# Patient Record
Sex: Male | Born: 1990 | Race: White | Hispanic: Yes | Marital: Single | Smoking: Never smoker
Health system: Southern US, Community
[De-identification: ages and names within clinical notes are randomized; demographics above are authoritative.]

---
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
## 2010-06-03 LAB — CBC
Hemoglobin: 15 g/dL (ref 13.0–17.0)
MCHC: 34.2 g/dL (ref 30.0–36.0)
MCHC: 34.8 g/dL (ref 30.0–36.0)
RBC: 4.8 MIL/uL (ref 4.22–5.81)
RDW: 12.5 % (ref 11.5–15.5)

## 2010-06-03 LAB — DIFFERENTIAL
Basophils Absolute: 0 10*3/uL (ref 0.0–0.1)
Basophils Relative: 0 % (ref 0–1)
Eosinophils Absolute: 0.1 10*3/uL (ref 0.0–0.7)
Lymphs Abs: 2.1 10*3/uL (ref 0.7–4.0)
Neutrophils Relative %: 63 % (ref 43–77)

## 2010-06-03 LAB — PROTIME-INR
INR: 1 (ref 0.00–1.49)
Prothrombin Time: 12.7 seconds (ref 11.6–15.2)

## 2010-06-03 LAB — BASIC METABOLIC PANEL
BUN: 13 mg/dL (ref 6–23)
CO2: 27 mEq/L (ref 19–32)
Calcium: 8.3 mg/dL — ABNORMAL LOW (ref 8.4–10.5)
Creatinine, Ser: 0.81 mg/dL (ref 0.4–1.5)
Glucose, Bld: 131 mg/dL — ABNORMAL HIGH (ref 70–99)

## 2010-06-03 LAB — COMPREHENSIVE METABOLIC PANEL
ALT: 37 U/L (ref 0–53)
Alkaline Phosphatase: 76 U/L (ref 39–117)
CO2: 26 mEq/L (ref 19–32)
Calcium: 8.8 mg/dL (ref 8.4–10.5)
GFR calc non Af Amer: >60 mL/min (ref >60)
Glucose, Bld: 141 mg/dL — ABNORMAL HIGH (ref 70–99)
Sodium: 139 mEq/L (ref 135–145)

## 2010-08-22 ENCOUNTER — Emergency Department (HOSPITAL_COMMUNITY)
Admission: EM | Admit: 2010-08-22 | Discharge: 2010-08-23 | Disposition: A | Discharge status: Home / Self Care | Payer: Self-pay | Attending: Emergency Medicine | Admitting: Emergency Medicine

## 2010-08-22 DIAGNOSIS — F1010 Alcohol abuse, uncomplicated: Secondary | ICD-10-CM | POA: Insufficient documentation

## 2015-03-11 ENCOUNTER — Emergency Department (HOSPITAL_COMMUNITY)
Admission: EM | Admit: 2015-03-11 | Discharge: 2015-03-12 | Disposition: A | Discharge status: Home / Self Care | Payer: No Typology Code available for payment source | Attending: Emergency Medicine | Admitting: Emergency Medicine

## 2015-03-11 ENCOUNTER — Encounter (HOSPITAL_COMMUNITY): Payer: Self-pay | Admitting: Emergency Medicine

## 2015-03-11 ENCOUNTER — Emergency Department (HOSPITAL_COMMUNITY): Payer: No Typology Code available for payment source

## 2015-03-11 DIAGNOSIS — Y9389 Activity, other specified: Secondary | ICD-10-CM | POA: Diagnosis not present

## 2015-03-11 DIAGNOSIS — X58XXXA Exposure to other specified factors, initial encounter: Secondary | ICD-10-CM | POA: Diagnosis not present

## 2015-03-11 DIAGNOSIS — S21201A Unspecified open wound of right back wall of thorax without penetration into thoracic cavity, initial encounter: Secondary | ICD-10-CM | POA: Insufficient documentation

## 2015-03-11 DIAGNOSIS — Y9289 Other specified places as the place of occurrence of the external cause: Secondary | ICD-10-CM | POA: Insufficient documentation

## 2015-03-11 DIAGNOSIS — Z23 Encounter for immunization: Secondary | ICD-10-CM | POA: Insufficient documentation

## 2015-03-11 DIAGNOSIS — M7989 Other specified soft tissue disorders: Secondary | ICD-10-CM | POA: Diagnosis not present

## 2015-03-11 DIAGNOSIS — S21211A Laceration without foreign body of right back wall of thorax without penetration into thoracic cavity, initial encounter: Secondary | ICD-10-CM

## 2015-03-11 DIAGNOSIS — Y998 Other external cause status: Secondary | ICD-10-CM | POA: Insufficient documentation

## 2015-03-11 LAB — PREPARE FRESH FROZEN PLASMA
Unit division: 0
Unit division: 0

## 2015-03-11 LAB — CBC
HEMATOCRIT: 39.9 % (ref 39.0–52.0)
Hemoglobin: 14.1 g/dL (ref 13.0–17.0)
MCH: 30.8 pg (ref 26.0–34.0)
MCHC: 35.3 g/dL (ref 30.0–36.0)
MCV: 87.1 fL (ref 78.0–100.0)
PLATELETS: 231 10*3/uL (ref 150–400)
RBC: 4.58 MIL/uL (ref 4.22–5.81)
RDW: 12.7 % (ref 11.5–15.5)
WBC: 9.9 10*3/uL (ref 4.0–10.5)

## 2015-03-11 LAB — COMPREHENSIVE METABOLIC PANEL
ALT: 51 U/L (ref 17–63)
AST: 39 U/L (ref 15–41)
Albumin: 4.3 g/dL (ref 3.5–5.0)
Alkaline Phosphatase: 68 U/L (ref 38–126)
Anion gap: 15 (ref 5–15)
BILIRUBIN TOTAL: 1.1 mg/dL (ref 0.3–1.2)
BUN: 16 mg/dL (ref 6–20)
CALCIUM: 9 mg/dL (ref 8.9–10.3)
CO2: 22 mmol/L (ref 22–32)
CREATININE: 1.35 mg/dL — AB (ref 0.61–1.24)
Chloride: 105 mmol/L (ref 101–111)
GFR calc Af Amer: >60 mL/min (ref >60)
Glucose, Bld: 173 mg/dL — ABNORMAL HIGH (ref 65–99)
POTASSIUM: 3.7 mmol/L (ref 3.5–5.1)
Sodium: 142 mmol/L (ref 135–145)
TOTAL PROTEIN: 6.8 g/dL (ref 6.5–8.1)

## 2015-03-11 LAB — ETHANOL

## 2015-03-11 LAB — ABO/RH: ABO/RH(D): O POS

## 2015-03-11 MED ORDER — TETANUS-DIPHTH-ACELL PERTUSSIS 5-2.5-18.5 LF-MCG/0.5 IM SUSP
INTRAMUSCULAR | Status: AC
  Administered 2015-03-11: 0.5 mL via INTRAMUSCULAR
  Filled 2015-03-11: qty 0.5

## 2015-03-11 MED ORDER — SODIUM CHLORIDE 0.9 % IV BOLUS (SEPSIS)
500.0000 mL | Freq: Once | INTRAVENOUS | Status: AC
  Administered 2015-03-11: 500 mL via INTRAVENOUS

## 2015-03-11 MED ORDER — FENTANYL CITRATE (PF) 100 MCG/2ML IJ SOLN
INTRAMUSCULAR | Status: AC
  Filled 2015-03-11: qty 2

## 2015-03-11 MED ORDER — TETANUS-DIPHTHERIA TOXOIDS TD 5-2 LFU IM INJ: 0.5000 mL | INJECTION | Freq: Once | INTRAMUSCULAR | Status: DC

## 2015-03-11 MED ORDER — IOHEXOL 300 MG/ML  SOLN
100.0000 mL | Freq: Once | INTRAMUSCULAR | Status: AC | PRN
  Administered 2015-03-11: 100 mL via INTRAVENOUS

## 2015-03-11 MED ORDER — FENTANYL CITRATE (PF) 100 MCG/2ML IJ SOLN
50.0000 ug | Freq: Once | INTRAMUSCULAR | Status: AC
Start: 2015-03-11 — End: 2015-03-11
  Administered 2015-03-11: 50 ug via INTRAVENOUS

## 2015-03-11 MED ORDER — SODIUM CHLORIDE 0.9 % IV BOLUS (SEPSIS)
125.0000 mL | Freq: Once | INTRAVENOUS | Status: AC
  Administered 2015-03-11: 500 mL via INTRAVENOUS

## 2015-03-11 MED ORDER — LIDOCAINE-EPINEPHRINE (PF) 2 %-1:200000 IJ SOLN
20.0000 mL | Freq: Once | INTRAMUSCULAR | Status: AC
  Administered 2015-03-11: 20 mL
  Filled 2015-03-11: qty 20

## 2015-03-11 NOTE — ED Notes (Signed)
PT. TRANSPORTED TO CT SCAN. 
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
## 2015-03-11 NOTE — Progress Notes (Signed)
   03/11/15 2200  Clinical Encounter Type  Visited With Patient and family together;Health care provider  Visit Type Initial;Psychological support;Spiritual support;Critical Care;ED;Trauma  Referral From Care management  Consult/Referral To None  Spiritual Encounters  Spiritual Needs Emotional  Stress Factors  Patient Stress Factors Exhausted  Family Stress Factors Exhausted   Chaplain Visited with Pt. And Pt's brother. And provided emotional support.
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
## 2015-03-12 LAB — TYPE AND SCREEN
ABO/RH(D): O POS
ANTIBODY SCREEN: NEGATIVE
UNIT DIVISION: 0
UNIT DIVISION: 0

## 2015-03-12 MED ORDER — AMOXICILLIN-POT CLAVULANATE 875-125 MG PO TABS: 1.0000 | ORAL_TABLET | Freq: Two times a day (BID) | ORAL | Status: AC

## 2015-03-12 MED ORDER — HYDROCODONE-ACETAMINOPHEN 5-325 MG PO TABS: 1.0000 | ORAL_TABLET | ORAL | Status: AC | PRN

## 2015-03-12 NOTE — ED Notes (Signed)
Patient is alert and orientedx4.  Patient was explained discharge instructions and they understood them with no questions.  His brother Tonny Bollmanzequiel Schone is here with him and understood the discharge instructions.  He is taking the patient home.

## 2015-03-12 NOTE — Discharge Instructions (Signed)
Cuidado de Patent examinerun desgarro en los adultos Follow up for suture removal in 1 week. Return to the ED if you develop new or worsening symptoms. (Laceration Care, Adult) Un desgarro es un corte que atraviesa todas las capas de la piel y llega al tejido que se encuentra debajo de la piel. Algunos desgarros cicatrizan por s solos. Otros se deben cerrar con puntos (suturas), grapas, tiras Su Hiltadhesivas o adhesivo para la piel. El cuidado adecuado de Patent examinerun desgarro reduce al [employer]	[place] Angelesmnimo el riesgo de infecciones y [place] Grenadinesayuda a una mejor cicatrizacin. CMO CUIDAR DEL DESGARRO Si se utilizaron suturas o grapas:  Mantenga la herida limpia y [place] Islandsseca.  Si le colocaron una venda (vendaje), debe cambiarla al menos una vez al da o como se lo haya indicado el mdico. Tambin debe cambiarla si se moja o se ensucia.  Mantenga la herida completamente seca durante las primeras 24horas o como se lo haya indicado el mdico. Transcurrido ese tiempo, puede ducharse o tomar baos de inmersin. No obstante, asegrese de no sumergir la herida en agua hasta que le hayan quitado las suturas o las grapas.  Limpie la herida una vez al da o como se lo haya indicado el mdico:  Lave la herida con agua y [place].  Enjuguela con agua para quitar todo el [place].  Seque dando palmaditas con una toalla limpia. No frote la herida.  Despus de limpiar la herida, aplique una delgada capa de ungento con antibitico como se lo haya indicado el mdico. Esto ayudar a prevenir las infecciones y a Public librarianevitar que el vendaje se adhiera a la herida.  Las suturas o las grapas deben retirarse como lo haya indicado el mdico. Si se utilizaron tiras ZOXWRUEAVadhesivas:  Mantenga la herida limpia y seca.  Si le colocaron una venda (vendaje), debe cambiarla al menos una vez al da o como se lo haya indicado el mdico. Tambin debe cambiarla si se moja o se ensucia.  No deje que las tiras [id number] F[address] Rdadhesivas se mojen. Puede baarse o ducharse, pero tenga cuidado de no mojar la  herida.  Si se moja, squela dando palmaditas con una toalla limpia. No frote la herida.  Las tiras Sheridanadhesivas se caen solas. Puede recortar las tiras a medida que la herida Warden/rangercicatriza. No quite las tiras Auto-Owners Insuranceadhesivas que an estn pegadas a la herida. Ellas se caern cuando sea el momento. Si se Carmel Sacramentoutiliz pegamento para la piel:  Trate de Photographermantener la herida seca; sin embargo, puede mojarla ligeramente cuando se bae o se duche. No sumerja la herida en el agua, por ejemplo, al nadar.  Despus de ducharse o baarse, seque la herida con cuidado dando palmaditas con una toalla limpia. No frote la herida.  No practique actividades que lo hagan transpirar mucho hasta que el Midlothianadhesivo se haya salido solo.  No aplique lquidos, cremas ni ungentos medicinales en la herida mientras est el QUALCOMMadhesivo. De lo contrario, puede despegar la pelcula de adhesivo antes de que la herida cicatrice.  Si le colocaron una venda (vendaje), debe cambiarla al menos una vez al da o como se lo haya indicado el mdico. Tambin debe cambiarla si se moja o se ensucia.  Si la herida est cubierta con un vendaje, tenga cuidado de no aplicar cinta adhesiva directamente Lehman Brotherssobre el adhesivo. De lo contrario, puede hacer que el Agua Dulceadhesivo se despegue antes de que la herida haya cicatrizado.  No toque el Camptiadhesivo. Normalmente, el Land O'Lakesadhesivo permanece sobre la piel de 5 a 10das y Express Scriptsluego se Aeronautical engineersale solo. Instrucciones generales  Tome los medicamentos de venta libre y los recetados solamente como se lo haya indicado el mdico.  Si le recetaron un ungento o un medicamento con antibitico, aplquelo o tmelo como se lo haya indicado el mdico. No deje de usarlo aunque la afeccin mejore.  Para ayudar a evitar la formacin de cicatrices, cbrase la herida con pantalla solar siempre que est al aire libre despus de que le hayan retirado los puntos o las tiras [place] o cuando todava tenga el [employer] en la piel y la herida haya cicatrizado.  Use una pantalla solar con factor de proteccin solar (FPS) de por lo menos30.  No se rasque ni se toque la herida.  Concurra a todas las visitas de control como se lo haya indicado el mdico. Esto es importante.  Controle la herida [employer] para detectar signos de infeccin. Est atento a lo siguiente:  Dolor, hinchazn o enrojecimiento.  Lquido, sangre o pus.  Cuando est sentado o acostado, eleve la zona de la lesin por encima del nivel del corazn, si es posible. SOLICITE ATENCIN MDICA SI:  Le aplicaron la antitetnica y tiene hinchazn, dolor intenso, enrojecimiento o hemorragia en el sitio de la inyeccin.  Tiene fiebre.  La herida estaba cerrada y se abre.  Percibe que sale mal olor de la herida o del vendaje.  Nota un cuerpo extrao en la herida, como un trozo de [place] o vidrio.  El dolor no se alivia con los [employer].  Tiene ms enrojecimiento, hinchazn o dolor en el lugar de la herida.  Observa lquido, sangre o pus que salen de la herida.  Observa que la piel cerca de la herida cambia de color.  Debe cambiar el vendaje con frecuencia debido a que hay secrecin de lquido, sangre o pus de la herida.  Aparece una nueva erupcin cutnea.  Tiene entumecimiento alrededor de la herida. SOLICITE ATENCIN MDICA DE INMEDIATO SI:  Tiene mucha hinchazn alrededor de la herida.  El dolor aumenta repentinamente y es intenso.  Tiene bultos dolorosos cerca de la herida o en la piel en cualquier parte del cuerpo.  Tiene una lnea roja que sale de la herida.  La herida est en la mano o en el pie y no puede mover correctamente uno de los dedos.  La herida est en la mano o en el pie y [employer] dedos tienen un tono plido o [place].   Esta informacin no tiene Theme park manager el consejo del mdico. Asegrese de hacerle al mdico cualquier pregunta que tenga.   Document Released: 02/13/2005 Document Revised: 06/30/2014 Elsevier Interactive  Patient Education [employer]

## 2015-03-15 ENCOUNTER — Telehealth (HOSPITAL_COMMUNITY): Payer: Self-pay | Admitting: Orthopedic Surgery

## 2015-03-16 NOTE — Telephone Encounter (Signed)
Scheduled appt for 1/25 for staple removal

## 2015-03-23 LAB — CDS SEROLOGY
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
